# Patient Record
Sex: Female | Born: 2005 | Race: Black or African American | Hispanic: No | Marital: Single | Smoking: Never smoker
Health system: Southern US, Community
[De-identification: ages and names within clinical notes are randomized; demographics above are authoritative.]

---
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
## 2023-08-12 ENCOUNTER — Emergency Department (HOSPITAL_COMMUNITY)

## 2023-08-12 ENCOUNTER — Other Ambulatory Visit: Payer: Self-pay

## 2023-08-12 ENCOUNTER — Encounter (HOSPITAL_COMMUNITY): Payer: Self-pay

## 2023-08-12 ENCOUNTER — Emergency Department (HOSPITAL_COMMUNITY)
Admission: EM | Admit: 2023-08-12 | Discharge: 2023-08-12 | Disposition: A | Discharge status: Home / Self Care | Attending: Student | Admitting: Student

## 2023-08-12 DIAGNOSIS — M542 Cervicalgia: Secondary | ICD-10-CM | POA: Insufficient documentation

## 2023-08-12 DIAGNOSIS — M79662 Pain in left lower leg: Secondary | ICD-10-CM | POA: Insufficient documentation

## 2023-08-12 DIAGNOSIS — Y92410 Unspecified street and highway as the place of occurrence of the external cause: Secondary | ICD-10-CM | POA: Insufficient documentation

## 2023-08-12 DIAGNOSIS — R519 Headache, unspecified: Secondary | ICD-10-CM | POA: Diagnosis not present

## 2023-08-12 DIAGNOSIS — S80812A Abrasion, left lower leg, initial encounter: Secondary | ICD-10-CM | POA: Insufficient documentation

## 2023-08-12 DIAGNOSIS — S8992XA Unspecified injury of left lower leg, initial encounter: Secondary | ICD-10-CM | POA: Diagnosis present

## 2023-08-12 MED ORDER — ACETAMINOPHEN 325 MG PO TABS
975.0000 mg | ORAL_TABLET | Freq: Once | ORAL | Status: AC
  Administered 2023-08-12: 975 mg via ORAL
  Filled 2023-08-12: qty 3
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
## 2023-08-12 NOTE — Discharge Instructions (Addendum)
 Thank you for letting us  evaluate you today.  Your CT of your head was negative for bleeding.  You had no fractures of your spine, knee, left lower leg.  Please try to get plenty of "brain rest" to include decrease screen time, sleeping.  You may take Tylenol and ibuprofen intermittently every 8 hours for pain as you will likely be sore over the next 5 to 7 days  Return to emergency department if you experience numbness or tingling in one-sided body, weakness in one-sided body, altered mentation, intractable vomiting, seizures, loss of consciousness
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
## 2024-02-02 ENCOUNTER — Inpatient Hospital Stay: Attending: Hematology and Oncology | Admitting: Hematology and Oncology

## 2024-02-02 ENCOUNTER — Encounter: Payer: Self-pay | Admitting: Hematology and Oncology

## 2024-02-02 ENCOUNTER — Inpatient Hospital Stay

## 2024-02-02 VITALS — BP 139/95 | HR 85 | Temp 98.1°F | Resp 18 | Ht 63.0 in | Wt 217.0 lb

## 2024-02-02 DIAGNOSIS — R413 Other amnesia: Secondary | ICD-10-CM | POA: Insufficient documentation

## 2024-02-02 DIAGNOSIS — S069XAS Unspecified intracranial injury with loss of consciousness status unknown, sequela: Secondary | ICD-10-CM | POA: Diagnosis present

## 2024-02-02 DIAGNOSIS — Z8673 Personal history of transient ischemic attack (TIA), and cerebral infarction without residual deficits: Secondary | ICD-10-CM | POA: Insufficient documentation

## 2024-02-02 DIAGNOSIS — S069X9D Unspecified intracranial injury with loss of consciousness of unspecified duration, subsequent encounter: Secondary | ICD-10-CM

## 2024-02-02 DIAGNOSIS — S069XAA Unspecified intracranial injury with loss of consciousness status unknown, initial encounter: Secondary | ICD-10-CM | POA: Insufficient documentation

## 2024-02-02 DIAGNOSIS — Z87820 Personal history of traumatic brain injury: Secondary | ICD-10-CM | POA: Insufficient documentation

## 2024-02-03 ENCOUNTER — Encounter: Payer: Self-pay | Admitting: Hematology and Oncology
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
## 2024-02-03 NOTE — Assessment & Plan Note (Signed)
 She has suffered from difficulties with short-term memory I recommend neurology consult

## 2024-02-27 ENCOUNTER — Other Ambulatory Visit: Payer: Self-pay

## 2024-02-27 ENCOUNTER — Emergency Department (HOSPITAL_COMMUNITY)

## 2024-02-27 ENCOUNTER — Encounter (HOSPITAL_COMMUNITY): Payer: Self-pay

## 2024-02-27 ENCOUNTER — Emergency Department (HOSPITAL_COMMUNITY)
Admission: EM | Admit: 2024-02-27 | Discharge: 2024-02-28 | Disposition: A | Attending: Emergency Medicine | Admitting: Emergency Medicine

## 2024-02-27 DIAGNOSIS — G44329 Chronic post-traumatic headache, not intractable: Secondary | ICD-10-CM | POA: Insufficient documentation

## 2024-02-27 LAB — CBG MONITORING, ED: Glucose-Capillary: 87 mg/dL (ref 70–99)

## 2024-02-27 LAB — COMPREHENSIVE METABOLIC PANEL WITH GFR
ALT: 14 U/L (ref 0–44)
AST: 22 U/L (ref 15–41)
Albumin: 4.6 g/dL (ref 3.5–5.0)
Alkaline Phosphatase: 83 U/L (ref 38–126)
Anion gap: 12 (ref 5–15)
BUN: 9 mg/dL (ref 6–20)
CO2: 24 mmol/L (ref 22–32)
Calcium: 10 mg/dL (ref 8.9–10.3)
Chloride: 102 mmol/L (ref 98–111)
Creatinine, Ser: 0.96 mg/dL (ref 0.44–1.00)
GFR, Estimated: >60 mL/min (ref >60)
Glucose, Bld: 93 mg/dL (ref 70–99)
Potassium: 3.8 mmol/L (ref 3.5–5.1)
Sodium: 139 mmol/L (ref 135–145)
Total Bilirubin: 0.3 mg/dL (ref 0.0–1.2)
Total Protein: 8.5 g/dL — ABNORMAL HIGH (ref 6.5–8.1)

## 2024-02-27 LAB — CBC
HCT: 48.3 % — ABNORMAL HIGH (ref 36.0–46.0)
Hemoglobin: 16.2 g/dL — ABNORMAL HIGH (ref 12.0–15.0)
MCH: 28.5 pg (ref 26.0–34.0)
MCHC: 33.5 g/dL (ref 30.0–36.0)
MCV: 84.9 fL (ref 80.0–100.0)
Platelets: 489 K/uL — ABNORMAL HIGH (ref 150–400)
RBC: 5.69 MIL/uL — ABNORMAL HIGH (ref 3.87–5.11)
RDW: 12 % (ref 11.5–15.5)
WBC: 6.6 K/uL (ref 4.0–10.5)
nRBC: 0 % (ref 0.0–0.2)

## 2024-02-27 LAB — HCG, SERUM, QUALITATIVE: Preg, Serum: NEGATIVE

## 2024-02-27 NOTE — ED Triage Notes (Addendum)
 Pt reports with dizziness and a headache in the front of her head since 8 pm. Pt reports having a hx of stroke and reports that this feels the same.

## 2024-02-28 ENCOUNTER — Emergency Department (HOSPITAL_COMMUNITY)

## 2024-02-28 MED ORDER — ONDANSETRON 8 MG PO TBDP
8.0000 mg | ORAL_TABLET | Freq: Once | ORAL | Status: AC
  Administered 2024-02-28: 8 mg via ORAL
  Filled 2024-02-28: qty 1

## 2024-02-28 MED ORDER — ACETAMINOPHEN 500 MG PO TABS
1000.0000 mg | ORAL_TABLET | Freq: Once | ORAL | Status: AC
  Administered 2024-02-28: 1000 mg via ORAL
  Filled 2024-02-28: qty 2

## 2024-02-28 MED ORDER — MECLIZINE HCL 25 MG PO TABS
12.5000 mg | ORAL_TABLET | Freq: Once | ORAL | Status: AC
  Administered 2024-02-28: 12.5 mg via ORAL
  Filled 2024-02-28: qty 1

## 2024-02-28 NOTE — ED Notes (Signed)
 Pt tolerated PO meds and water without coughing or choking, after swallowing the tylenol  pt stated oh I feel so much better already. MD aware.

## 2024-02-28 NOTE — ED Provider Notes (Signed)
 [place] EMERGENCY DEPARTMENT AT [facility] Provider Note   CSN: [id number] Arrival date & time: 02/27/24  2231     Patient presents with: Dizziness   Kayla Mays is a 18 y.o. female.   The history is provided by the patient and a relative. The history is limited by the condition of the patient.  Dizziness Quality:  Lightheadedness Severity:  Moderate Onset quality:  Gradual Duration:  6 months Timing:  Constant Progression:  Unchanged Chronicity:  Chronic Context: not when bending over, not with bowel movement, not with ear pain, not with eye movement, not with head movement, not with inactivity, not with loss of consciousness, not with medication, not with physical activity, not when standing up and not when urinating   Relieved by:  Nothing Worsened by:  Nothing Ineffective treatments:  None tried Associated symptoms: headaches   Associated symptoms: no blood in stool, no chest pain, no diarrhea, no hearing loss, no nausea, no palpitations, no shortness of breath, no syncope, no tinnitus, no vision changes, no vomiting and no weakness   Headaches ongoing since TBI.  No changes in pattern or symptoms, this has been going on since diagnosis.  No medications taken.  Patient has not followed up with neurology or PMD for this to date.       Prior to Admission medications  Not on File    Allergies: Amoxicillin    Review of Systems  Constitutional:  Negative for fever.  HENT:  Negative for hearing loss and tinnitus.   Respiratory:  Negative for shortness of breath.   Cardiovascular:  Negative for chest pain, palpitations and syncope.  Gastrointestinal:  Negative for blood in stool, diarrhea, nausea and vomiting.  Neurological:  Positive for light-headedness and headaches. Negative for syncope, speech difficulty and weakness.  All other systems reviewed and are negative.   Updated Vital Signs BP (!) 136/97   Pulse (!) 113   Temp 98.5 F (36.9 C)    Resp 20   SpO2 100%   Physical Exam Vitals and nursing note reviewed.  Constitutional:      General: She is not in acute distress.    Appearance: Normal appearance. She is well-developed.  HENT:     Head: Normocephalic and atraumatic.     Nose: Nose normal.     Mouth/Throat:     Mouth: Mucous membranes are moist.     Pharynx: Oropharynx is clear.  Eyes:     Extraocular Movements: Extraocular movements intact.     Pupils: Pupils are equal, round, and reactive to light.  Cardiovascular:     Rate and Rhythm: Normal rate and regular rhythm.     Pulses: Normal pulses.     Heart sounds: Normal heart sounds.  Pulmonary:     Effort: Pulmonary effort is normal. No respiratory distress.     Breath sounds: Normal breath sounds.  Abdominal:     General: Bowel sounds are normal. There is no distension.     Palpations: Abdomen is soft.     Tenderness: There is no abdominal tenderness. There is no guarding or rebound.  Musculoskeletal:        General: Normal range of motion.     Cervical back: Normal range of motion and neck supple.  Skin:    General: Skin is dry.     Capillary Refill: Capillary refill takes less than 2 seconds.     Findings: No erythema or rash.  Neurological:     General: No focal  deficit present.     Mental Status: She is alert.     Cranial Nerves: No cranial nerve deficit.     Gait: Gait normal.     Deep Tendon Reflexes: Reflexes normal.  Psychiatric:        Mood and Affect: Mood normal.     (all labs ordered are listed, but only abnormal results are displayed) Results for orders placed or performed during the hospital encounter of 02/27/24  CBG monitoring, ED   Collection Time: 02/27/24 10:42 PM  Result Value Ref Range   Glucose-Capillary 87 70 - 99 mg/dL  Comprehensive metabolic panel   Collection Time: 02/27/24 10:58 PM  Result Value Ref Range   Sodium 139 135 - 145 mmol/L   Potassium 3.8 3.5 - 5.1 mmol/L   Chloride 102 98 - 111 mmol/L   CO2 24 22 -  32 mmol/L   Glucose, Bld 93 70 - 99 mg/dL   BUN 9 6 - 20 mg/dL   Creatinine, Ser 9.03 0.44 - 1.00 mg/dL   Calcium 89.9 8.9 - 89.6 mg/dL   Total Protein 8.5 (H) 6.5 - 8.1 g/dL   Albumin 4.6 3.5 - 5.0 g/dL   AST 22 15 - 41 U/L   ALT 14 0 - 44 U/L   Alkaline Phosphatase 83 38 - 126 U/L   Total Bilirubin 0.3 0.0 - 1.2 mg/dL   GFR, Estimated >39 >39 mL/min   Anion gap 12 5 - 15  CBC   Collection Time: 02/27/24 10:58 PM  Result Value Ref Range   WBC 6.6 4.0 - 10.5 K/uL   RBC 5.69 (H) 3.87 - 5.11 MIL/uL   Hemoglobin 16.2 (H) 12.0 - 15.0 g/dL   HCT 51.6 (H) 63.9 - 53.9 %   MCV 84.9 80.0 - 100.0 fL   MCH 28.5 26.0 - 34.0 pg   MCHC 33.5 30.0 - 36.0 g/dL   RDW 87.9 88.4 - 84.4 %   Platelets 489 (H) 150 - 400 K/uL   nRBC 0.0 0.0 - 0.2 %  hCG, serum, qualitative   Collection Time: 02/27/24 10:58 PM  Result Value Ref Range   Preg, Serum NEGATIVE NEGATIVE   CT Head Wo Contrast Result Date: 02/28/2024 EXAM: CT HEAD WITHOUT 02/28/2024 12:09:28 AM TECHNIQUE: CT of the head was performed without the administration of intravenous contrast. Automated exposure control, iterative reconstruction, and/or weight based adjustment of the mA/kV was utilized to reduce the radiation dose to as low as reasonably achievable. COMPARISON: 08/12/2023 CLINICAL HISTORY: Headache, neuro deficit; headache, dizzy, prior stroke. FINDINGS: BRAIN AND VENTRICLES: No acute intracranial hemorrhage. No mass effect or midline shift. No extra-axial fluid collection. No evidence of acute infarct. No hydrocephalus. 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 ORBITS: No acute abnormality. SINUSES AND MASTOIDS: No acute abnormality. SOFT TISSUES AND SKULL: No acute skull fracture. No acute soft tissue abnormality. IMPRESSION: 1. No acute intracranial abnormality. 2. Old bilateral infarcts, as above. Electronically signed by: Pinkie Pebbles MD 02/28/2024 12:11 AM EST RP Workstation: [id number]     Radiology: CT Head Wo Contrast Result Date: 02/28/2024 EXAM: CT HEAD WITHOUT 02/28/2024 12:09:28 AM TECHNIQUE: CT of the head was performed without the administration of intravenous contrast. Automated exposure control, iterative reconstruction, and/or weight based adjustment of the mA/kV was utilized to reduce the radiation dose to as low as reasonably achievable. COMPARISON: 08/12/2023 CLINICAL HISTORY: Headache, neuro deficit; headache, dizzy, prior stroke. FINDINGS: BRAIN AND VENTRICLES: No acute intracranial hemorrhage. No mass effect or midline  shift. No extra-axial fluid collection. No evidence of acute infarct. No hydrocephalus. 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 ORBITS: No acute abnormality. SINUSES AND MASTOIDS: No acute abnormality. SOFT TISSUES AND SKULL: No acute skull fracture. No acute soft tissue abnormality. IMPRESSION: 1. No acute intracranial abnormality. 2. Old bilateral infarcts, as above. Electronically signed by: Pinkie Pebbles MD 02/28/2024 12:11 AM EST RP Workstation: [id number]     Procedures   Medications Ordered in the ED  acetaminophen  (TYLENOL ) tablet 1,000 mg (1,000 mg Oral Given 02/28/24 0037)  meclizine  (ANTIVERT ) tablet 12.5 mg (12.5 mg Oral Given 02/28/24 0037)  ondansetron  (ZOFRAN -ODT) disintegrating tablet 8 mg (8 mg Oral Given 02/28/24 0038)                                    Medical Decision Making Headache and lightheadedness since may   Amount and/or Complexity of Data Reviewed Independent Historian:     Details: Relative see above  External Data Reviewed: labs, radiology and notes.    Details: Previous admissions reviewed  Labs: ordered.    Details: Pregnancy is negative. Sodium is 139, potassium is 3.8, normal creatinine.  Normal white count 6.6, hemoglobin 16.2, elevated.   Radiology: ordered and independent interpretation performed.    Details: Chronic changes   Risk OTC drugs. Prescription drug  management. Risk Details: Well appearing patient and family state there are been no change in headache or dizziness since TBI.  Tylenol  ameliorated the pain completely.  This is not an acute stroke, there's been no new trauma since May.   I have advised close follow up with PMD and neurologist.  Stable for discharge, strict returns     Final diagnoses:  Chronic post-traumatic headache, not intractable   No signs of systemic illness or infection. The patient is nontoxic-appearing on exam and vital signs are within normal limits.  I have reviewed the triage vital signs and the nursing notes. Pertinent labs & imaging results that were available during my care of the patient were reviewed by me and considered in my medical decision making (see chart for details). After history, exam, and medical workup I feel the patient has been appropriately medically screened and is safe for discharge home. Pertinent diagnoses were discussed with the patient. Patient was given return precautions.    ED Discharge Orders     None          Lisaanne Lawrie, MD 02/28/24 9841

## 2024-07-08 ENCOUNTER — Ambulatory Visit: Admitting: Neurology
